# Patient Record
Sex: Female | Born: 1980 | Race: Black or African American | Hispanic: No | Smoking: Current every day smoker
Health system: Southern US, Community
[De-identification: ages and names within clinical notes are randomized; demographics above are authoritative.]

## PROBLEM LIST (undated history)

## (undated) DIAGNOSIS — I10 Essential (primary) hypertension: Secondary | ICD-10-CM

---

## 2019-02-21 ENCOUNTER — Emergency Department: Payer: Self-pay

## 2019-02-21 ENCOUNTER — Encounter: Payer: Self-pay | Admitting: Emergency Medicine

## 2019-02-21 ENCOUNTER — Emergency Department
Admission: EM | Admit: 2019-02-21 | Discharge: 2019-02-21 | Disposition: A | Discharge status: Home / Self Care | Payer: Self-pay | Attending: Emergency Medicine | Admitting: Emergency Medicine

## 2019-02-21 ENCOUNTER — Other Ambulatory Visit: Payer: Self-pay

## 2019-02-21 DIAGNOSIS — R0789 Other chest pain: Secondary | ICD-10-CM | POA: Insufficient documentation

## 2019-02-21 DIAGNOSIS — R0602 Shortness of breath: Secondary | ICD-10-CM | POA: Insufficient documentation

## 2019-02-21 DIAGNOSIS — R002 Palpitations: Secondary | ICD-10-CM | POA: Insufficient documentation

## 2019-02-21 DIAGNOSIS — Z87891 Personal history of nicotine dependence: Secondary | ICD-10-CM | POA: Insufficient documentation

## 2019-02-21 DIAGNOSIS — Z79899 Other long term (current) drug therapy: Secondary | ICD-10-CM | POA: Insufficient documentation

## 2019-02-21 DIAGNOSIS — I10 Essential (primary) hypertension: Secondary | ICD-10-CM | POA: Insufficient documentation

## 2019-02-21 HISTORY — DX: Essential (primary) hypertension: I10

## 2019-02-21 LAB — CBC
HCT: 43.3 % (ref 36.0–46.0)
Hemoglobin: 14.6 g/dL (ref 12.0–15.0)
MCH: 28.6 pg (ref 26.0–34.0)
MCHC: 33.7 g/dL (ref 30.0–36.0)
MCV: 84.7 fL (ref 80.0–100.0)
Platelets: 299 10*3/uL (ref 150–400)
RBC: 5.11 MIL/uL (ref 3.87–5.11)
RDW: 13 % (ref 11.5–15.5)
WBC: 6.6 10*3/uL (ref 4.0–10.5)
nRBC: 0 % (ref 0.0–0.2)

## 2019-02-21 LAB — BASIC METABOLIC PANEL
Anion gap: 14 (ref 5–15)
BUN: 18 mg/dL (ref 6–20)
CO2: 20 mmol/L — ABNORMAL LOW (ref 22–32)
Calcium: 10.3 mg/dL (ref 8.9–10.3)
Chloride: 102 mmol/L (ref 98–111)
Creatinine, Ser: 1.16 mg/dL — ABNORMAL HIGH (ref 0.44–1.00)
GFR calc Af Amer: >60 mL/min (ref >60)
GFR calc non Af Amer: >60 mL/min (ref >60)
Glucose, Bld: 150 mg/dL — ABNORMAL HIGH (ref 70–99)
Potassium: 3.3 mmol/L — ABNORMAL LOW (ref 3.5–5.1)
Sodium: 136 mmol/L (ref 135–145)

## 2019-02-21 LAB — TROPONIN I (HIGH SENSITIVITY)
Troponin I (High Sensitivity): 16 ng/L (ref <18)
Troponin I (High Sensitivity): 26 ng/L — ABNORMAL HIGH (ref <18)

## 2019-02-21 LAB — FIBRIN DERIVATIVES D-DIMER (ARMC ONLY): Fibrin derivatives D-dimer (ARMC): 265.46 ng/mL (FEU) (ref 0.00–499.00)

## 2019-02-21 MED ORDER — LIDOCAINE HCL 1 % IJ SOLN
0.50 | INTRAMUSCULAR | Status: DC
Start: ? — End: 2019-02-21

## 2019-02-21 NOTE — ED Triage Notes (Signed)
Patient brought in by ems from home. Patient with complaint of shortness of breath that started today. Patient was seen at [facility] today for the same and was discharged with HCTZ and an inhaler. Patient had a recent Covid test that was negative. VSS for EMS.

## 2019-02-21 NOTE — Discharge Instructions (Signed)
As we discussed, your work-up was reassuring tonight.  I think you may be suffering from episodes of supraventricular tachycardia (SVT) although this is difficult to prove without extended heart monitoring in the outpatient setting and less an episode is caught on an EKG while you are in the emergency department.  Please read through the included information and I recommend that you call the office of Dr. Nehemiah Massed to schedule a follow-up appointment with cardiology.  Let them know you are falling out from the emergency department visit.  In the meantime I recommend you avoid caffeine and other stimulants, stay hydrated, and take your regular medications.  Return to the emergency department if you develop new or worsening symptoms that concern you.

## 2019-02-21 NOTE — ED Provider Notes (Signed)
[facility] Emergency Department Provider Note  ____________________________________________   First MD Initiated Contact with Patient 02/21/19 [phone]     (approximate)  I have reviewed the triage vital signs and the nursing notes.   HISTORY  Chief Complaint Shortness of Breath    HPI Krista Sanchez is a 38 y.o. female with medical history as listed below who presents for evaluation of shortness of breath.  She says that she has been having episodes of shortness of breath for about a year.  They have gotten worse recently.  She says that the episodes start with feeling like her heart is beating very fast and then she becomes short of breath and occasionally feels some pressure on her chest.  The episodes may be very brief or may last 15 to 20 minutes.  Nothing in particular makes them better or worse and they can happen at any time.  She thinks that because she has been thinking about it and worried about a more than usual, being stressed out and worried about it seems to be making it worse.  She has not been around anyone in particular with COVID-19 although she does work in Water engineer at [employer].   She tested negative for COVID-19 about 3 weeks ago.  She has been seen at [facility] and at [facility] recently including just yesterday at [facility] for the shortness of breath.  She says that after she left, a few hours ago, she was at home and had acute onset of the same shortness of breath and rapid heartbeat sensation and it did not get better for about 15 minutes so she called 911.  She is asymptomatic at this time.  She stopped smoking about 2 months ago and that has not helped.  She was given an albuterol inhaler she tried using that when she was feeling short of breath earlier but she feels like it made it feel worse.  She denies fever/chills, sore throat, loss of smell or taste, cough, chest pain other than occasional discomfort with her episodes, nausea, vomiting, and  abdominal pain.        Past Medical History:  Diagnosis Date   Hypertension     There are no active problems to display for this patient.   History reviewed. No pertinent surgical history.  Prior to Admission medications   Medication Sig Start Date End Date Taking? Authorizing Provider  albuterol (VENTOLIN HFA) 108 (90 Base) MCG/ACT inhaler Inhale 1-2 puffs into the lungs every 4 (four) hours as needed.  02/20/19 03/22/19 Yes [provider]  hydrochlorothiazide (MICROZIDE) 12.5 MG capsule Take 12.5 mg by mouth daily. If blood pressure is greater than 120/80, you can take two capsules. 02/20/19 05/21/19 Yes [provider]  NIFEdipine (ADALAT CC) 90 MG 24 hr tablet Take 90 mg by mouth daily.  04/30/17  Yes [provider]    Allergies Patient has no known allergies.  History reviewed. No pertinent family history.  Social History Social History   Tobacco Use   Smoking status: Former Smoker    Quit date: 12/21/2018    Years since quitting: 0.1   Smokeless tobacco: Never Used  Substance Use Topics   Alcohol use: Not on file   Drug use: Not on file    Review of Systems Constitutional: No fever/chills Eyes: No visual changes. ENT: No sore throat. Cardiovascular: Denies chest pain. Respiratory: Denies shortness of breath. Gastrointestinal: No abdominal pain.  No nausea, no vomiting.  No diarrhea.  No  constipation. Genitourinary: Negative for dysuria. Musculoskeletal: Negative for neck pain.  Negative for back pain. Integumentary: Negative for rash. Neurological: Negative for headaches, focal weakness or numbness.   ____________________________________________   PHYSICAL EXAM:  VITAL SIGNS: ED Triage Vitals  Enc Vitals Group     BP 02/21/19 0300 (!) 136/99     Pulse Rate 02/21/19 0300 82     Resp 02/21/19 0300 15     Temp --      Temp src --      SpO2 02/21/19 0300 99 %     Weight 02/21/19 0020 73.5 kg (162 lb)     Height 02/21/19  0020 1.575 m (5\' 2" )     Head Circumference --      Peak Flow --      Pain Score 02/21/19 0020 8     Pain Loc --      Pain Edu? --      Excl. in GC? --     Constitutional: Alert and oriented. Well appearing and in no acute distress. Eyes: Conjunctivae are normal.  Head: Atraumatic. Nose: No congestion/rhinnorhea. Mouth/Throat: Mucous membranes are moist. Neck: No stridor.  No meningeal signs.   Cardiovascular: Variable tachycardia in the 110 - 130 range, regular rhythm. Good peripheral circulation. Grossly normal heart sounds. Respiratory: Normal respiratory effort.  No retractions. No audible wheezing. Gastrointestinal: Soft and nontender. No distention.  Musculoskeletal: No lower extremity tenderness nor edema. No gross deformities of extremities. Neurologic:  Normal speech and language. No gross focal neurologic deficits are appreciated.  Skin:  Skin is warm, dry and intact. No rash noted. Psychiatric: Mood and affect are normal. Speech and behavior are normal.  ____________________________________________   LABS (all labs ordered are listed, but only abnormal results are displayed)  Labs Reviewed  BASIC METABOLIC PANEL - Abnormal; Notable for the following components:      Result Value   Potassium 3.3 (*)    CO2 20 (*)    Glucose, Bld 150 (*)    Creatinine, Ser 1.16 (*)    All other components within normal limits  TROPONIN I (HIGH SENSITIVITY) - Abnormal; Notable for the following components:   Troponin I (High Sensitivity) 26 (*)    All other components within normal limits  CBC  FIBRIN DERIVATIVES D-DIMER ([facility] ONLY)  POC URINE PREG, ED  TROPONIN I (HIGH SENSITIVITY)   ____________________________________________  EKG  ED ECG REPORT I, Loleta Roseory Layce Sprung, the attending physician, personally viewed and interpreted this ECG.  Date: 02/21/2019 EKG Time: 00:28 Rate: 95 Rhythm: normal sinus rhythm QRS Axis: normal Intervals: normal ST/T Wave abnormalities: The  patient has inverted T waves in leads V4, V5, and V6 as well as in lead III and aVF.  I have no old EKGs against which to compare. Narrative Interpretation: no definitive evidence of acute ischemia; does not meet STEMI criteria.   ____________________________________________  RADIOLOGY I, Loleta Roseory Tamora Huneke, personally viewed and evaluated these images (plain radiographs) as part of my medical decision making, as well as reviewing the written report by the radiologist.  ED MD interpretation: No acute abnormality on chest x-ray  Official radiology report(s): Dg Chest Port 1 View  Result Date: 02/21/2019 CLINICAL DATA:  Shortness of breath EXAM: PORTABLE CHEST 1 VIEW COMPARISON:  None. FINDINGS: The heart size and mediastinal contours are within normal limits. Both lungs are clear. The visualized skeletal structures are unremarkable. IMPRESSION: No active disease. Electronically Signed   By: Katherine Mantlehristopher  Green M.D.   On: 02/21/2019 02:34  ____________________________________________   PROCEDURES   Procedure(s) performed (including Critical Care):  Procedures   ____________________________________________   INITIAL IMPRESSION / MDM / ASSESSMENT AND PLAN / ED COURSE  As part of my medical decision making, I reviewed the following data within the electronic MEDICAL RECORD NUMBER Nursing notes reviewed and incorporated, Labs reviewed , EKG interpreted , Old chart reviewed, Radiograph reviewed , Notes from prior ED visits   Differential diagnosis includes, but is not limited to, PSVT, PE, anxiety/panic attacks, COPD.  The description the patient is giving sounds suspicious to me for PSVT.  She very clearly states that she feels like the rapid heart rate starts first followed by the shortness of breath and occasional chest pressure.  She has been having these "episodes" for months even though they have been getting more severe.  I suspect anxiety is playing a component but I think she may have  occasional ectopic beats if not regular SVT episodes, but I have been unable to capture it on the monitor or on the EKG.  She is currently asymptomatic.  I reviewed the medical record [facility]  She had a normal d-dimer at that time but she is currently mildly tachycardic and I I feel it is incumbent upon me to make sure that her dimer is not elevated at this time and our emergency department with our lab.  Of note, she says that the albuterol made her feel worse and that could be consistent with her symptoms being the result of SVT.  After we discussed symptoms for a little while and discussed my working theory and differential diagnosis, we agreed to check a d-dimer tonight.  If it is elevated we will get a CTA chest to rule out pulmonary embolism but I think this is unlikely.  Her first high-sensitivity troponin was 16 so a second 1 is pending.  If her results are generally reassuring I told her that discharge and outpatient follow-up with cardiology will likely be the plan.  She understands and agrees with plan.      Clinical Course as of Feb 21 548  Wynelle LinkSun Feb 21, 2019  [id number] No acute abnormalities on CXR  [facility] [CF]  [phone] Second high-sensitivity troponin is 26 with a delta of 10 from the original.  This technically qualifies for observation/admission, but the patient was here for shortness of breath, not primarily for chest pain.  I will reassess after her d-dimer is back.  At this point I do not necessarily think she needs to be admitted but I will reassess once her dimer has resulted.  Troponin I (High Sensitivity)(!): 26 [CF]  0544 D-dimer was within normal limits.  Checking the patient as she has not had any episodes during the 5 and half hours she has been in the emergency department.  As I mentioned previously I do not think that the low troponin is significant for a primary cardiac issue and she should be appropriate for discharge and outpatient  follow-up.  I am sending a note to Dr. Gwen PoundsKowalski and giving her contact information to help establish a follow-up appointment with cardiology and to discuss whether she would benefit from cardiac monitoring as an outpatient.  I gave my usual customary return precautions and she understands and agrees with the plan.  Fibrin derivatives D-dimer [facility]): 265.46 [CF]    Clinical Course User Index [CF] Loleta RoseForbach, Henrick Mcgue, MD     ____________________________________________  FINAL CLINICAL IMPRESSION(S) / ED DIAGNOSES  Final diagnoses:  SOB (shortness of breath)  Palpitations     MEDICATIONS GIVEN DURING THIS VISIT:  Medications - No data to display   ED Discharge Orders    None      *Please note:  Krista Sanchez was evaluated in Emergency Department on 02/21/2019 for the symptoms described in the history of present illness. She was evaluated in the context of the global COVID-19 pandemic, which necessitated consideration that the patient might be at risk for infection with the SARS-CoV-2 virus that causes COVID-19. Institutional protocols and algorithms that pertain to the evaluation of patients at risk for COVID-19 are in a state of rapid change based on information released by regulatory bodies including the CDC and federal and state organizations. These policies and algorithms were followed during the patient's care in the ED.  Some ED evaluations and interventions may be delayed as a result of limited staffing during the pandemic.*  Note:  This document was prepared using Dragon voice recognition software and may include unintentional dictation errors.   Loleta RoseForbach, Christoher Drudge, MD 02/21/19 [phone]

## 2019-10-28 ENCOUNTER — Emergency Department: Payer: BC Managed Care – PPO

## 2019-10-28 ENCOUNTER — Encounter: Payer: Self-pay | Admitting: Emergency Medicine

## 2019-10-28 ENCOUNTER — Emergency Department
Admission: EM | Admit: 2019-10-28 | Discharge: 2019-10-28 | Disposition: A | Discharge status: Home / Self Care | Payer: BC Managed Care – PPO | Attending: Emergency Medicine | Admitting: Emergency Medicine

## 2019-10-28 ENCOUNTER — Other Ambulatory Visit: Payer: Self-pay

## 2019-10-28 DIAGNOSIS — R0789 Other chest pain: Secondary | ICD-10-CM | POA: Insufficient documentation

## 2019-10-28 DIAGNOSIS — Z5321 Procedure and treatment not carried out due to patient leaving prior to being seen by health care provider: Secondary | ICD-10-CM | POA: Insufficient documentation

## 2019-10-28 LAB — CBC
HCT: 41.5 % (ref 36.0–46.0)
Hemoglobin: 13.6 g/dL (ref 12.0–15.0)
MCH: 27.8 pg (ref 26.0–34.0)
MCHC: 32.8 g/dL (ref 30.0–36.0)
MCV: 84.9 fL (ref 80.0–100.0)
Platelets: 251 10*3/uL (ref 150–400)
RBC: 4.89 MIL/uL (ref 3.87–5.11)
RDW: 14.6 % (ref 11.5–15.5)
WBC: 7.1 10*3/uL (ref 4.0–10.5)
nRBC: 0 % (ref 0.0–0.2)

## 2019-10-28 LAB — BASIC METABOLIC PANEL
Anion gap: 12 (ref 5–15)
BUN: 15 mg/dL (ref 6–20)
CO2: 21 mmol/L — ABNORMAL LOW (ref 22–32)
Calcium: 9.7 mg/dL (ref 8.9–10.3)
Chloride: 107 mmol/L (ref 98–111)
Creatinine, Ser: 1 mg/dL (ref 0.44–1.00)
GFR calc Af Amer: >60 mL/min (ref >60)
GFR calc non Af Amer: >60 mL/min (ref >60)
Glucose, Bld: 98 mg/dL (ref 70–99)
Potassium: 3.8 mmol/L (ref 3.5–5.1)
Sodium: 140 mmol/L (ref 135–145)

## 2019-10-28 LAB — TROPONIN I (HIGH SENSITIVITY): Troponin I (High Sensitivity): 27 ng/L — ABNORMAL HIGH (ref <18)

## 2019-10-28 NOTE — ED Triage Notes (Signed)
Pt called for reassessment and specimen collection of second troponin, no response

## 2019-10-28 NOTE — ED Triage Notes (Signed)
Pt called from [facility] to triage room, no response

## 2019-10-28 NOTE — ED Triage Notes (Signed)
Lab called for blood draw assistance 

## 2019-10-28 NOTE — ED Triage Notes (Signed)
Pt called from [facility] to triage room for reassessment and specimen collection

## 2019-10-28 NOTE — ED Triage Notes (Signed)
Pt here for CP starting yesterday. Hx of same but worse than normal. No SHOB, but feels like hard to take a deep breath. VSS. NAD. Unlabored. Color WNL

## 2019-10-29 ENCOUNTER — Telehealth: Payer: Self-pay | Admitting: Emergency Medicine

## 2019-11-01 NOTE — Telephone Encounter (Signed)
Called patient again due to left without seeing provider.  She has not called her doctor.   I explained that troponin was elevated. I advised her to call her doctor or her cardiologist to ask them to check her lab results.  She says she is taking her bp med regularly.

## 2020-11-13 ENCOUNTER — Encounter: Payer: Self-pay | Admitting: Physician Assistant

## 2020-11-13 ENCOUNTER — Emergency Department: Payer: Self-pay

## 2020-11-13 ENCOUNTER — Emergency Department
Admission: EM | Admit: 2020-11-13 | Discharge: 2020-11-13 | Disposition: A | Discharge status: Home / Self Care | Payer: Self-pay | Attending: Emergency Medicine | Admitting: Emergency Medicine

## 2020-11-13 ENCOUNTER — Other Ambulatory Visit: Payer: Self-pay

## 2020-11-13 DIAGNOSIS — F17200 Nicotine dependence, unspecified, uncomplicated: Secondary | ICD-10-CM | POA: Insufficient documentation

## 2020-11-13 DIAGNOSIS — Z79899 Other long term (current) drug therapy: Secondary | ICD-10-CM | POA: Insufficient documentation

## 2020-11-13 DIAGNOSIS — W108XXA Fall (on) (from) other stairs and steps, initial encounter: Secondary | ICD-10-CM | POA: Insufficient documentation

## 2020-11-13 DIAGNOSIS — I10 Essential (primary) hypertension: Secondary | ICD-10-CM | POA: Insufficient documentation

## 2020-11-13 DIAGNOSIS — M1120 Other chondrocalcinosis, unspecified site: Secondary | ICD-10-CM

## 2020-11-13 DIAGNOSIS — M79672 Pain in left foot: Secondary | ICD-10-CM | POA: Insufficient documentation

## 2020-11-13 DIAGNOSIS — S39012A Strain of muscle, fascia and tendon of lower back, initial encounter: Secondary | ICD-10-CM | POA: Insufficient documentation

## 2020-11-13 DIAGNOSIS — M25572 Pain in left ankle and joints of left foot: Secondary | ICD-10-CM | POA: Insufficient documentation

## 2020-11-13 DIAGNOSIS — W19XXXA Unspecified fall, initial encounter: Secondary | ICD-10-CM

## 2020-11-13 DIAGNOSIS — M545 Low back pain, unspecified: Secondary | ICD-10-CM

## 2020-11-13 LAB — POC URINE PREG, ED: Preg Test, Ur: NEGATIVE

## 2020-11-13 MED ORDER — NIFEDIPINE ER OSMOTIC RELEASE 90 MG PO TB24: 90.0000 mg | ORAL_TABLET | Freq: Every day | ORAL | 6 refills | Status: DC

## 2020-11-13 MED ORDER — MELOXICAM 15 MG PO TABS: 15.0000 mg | ORAL_TABLET | Freq: Every day | ORAL | 2 refills | Status: AC

## 2020-11-13 MED ORDER — TRAMADOL HCL 50 MG PO TABS: 50.0000 mg | ORAL_TABLET | Freq: Four times a day (QID) | ORAL | 0 refills | Status: DC | PRN

## 2020-11-13 NOTE — ED Triage Notes (Signed)
Pt fell on Friday and injured left leg/knee. Pt states the left ankle has been fractured in the past. Pain when walking. Unable to bear much weight.

## 2020-11-13 NOTE — ED Notes (Signed)
See triage note  Presents with pain to left foot/ankle pain  States she fell on Friday  Landed on both knees  Abrasions noted to both knees  But having pain mainly to left ankle area  Unable to bear full wt  Also having lower back pain from fall

## 2020-11-13 NOTE — ED Provider Notes (Signed)
[facility] Emergency Department Provider Note  ____________________________________________   Event Date/Time   First MD Initiated Contact with Patient 11/13/20 1142     (approximate)  I have reviewed the triage vital signs and the nursing notes.   HISTORY  Chief Complaint Leg Injury    HPI Krista Sanchez is a 40 y.o. female [facility] emergency department complaining of left foot and ankle pain.  States she fell on Friday.  Landed on both knees.  States that she was coming down steps and tried to mis-stepping on her child and fell forwards.  She is also complaining of low back pain at this time.  Patient states she has been out of her blood pressure medication for about 4 days.    Past Medical History:  Diagnosis Date  . Hypertension     There are no problems to display for this patient.   History reviewed. No pertinent surgical history.  Prior to Admission medications   Medication Sig Start Date End Date Taking? Authorizing Provider  meloxicam (MOBIC) 15 MG tablet Take 1 tablet (15 mg total) by mouth daily. 11/13/20 11/13/21 Yes Chaim Gatley, Roselyn Bering, PA-C  NIFEdipine (PROCARDIA XL/NIFEDICAL-XL) 90 MG 24 hr tablet Take 1 tablet (90 mg total) by mouth daily. 11/13/20  Yes Tayley Mudrick, Roselyn Bering, PA-C  traMADol (ULTRAM) 50 MG tablet Take 1 tablet (50 mg total) by mouth every 6 (six) hours as needed. 11/13/20  Yes Kahlea Cobert, Roselyn Bering, PA-C  albuterol (VENTOLIN HFA) 108 (90 Base) MCG/ACT inhaler Inhale 1-2 puffs into the lungs every 4 (four) hours as needed.  02/20/19 03/22/19  [provider]  hydrochlorothiazide (MICROZIDE) 12.5 MG capsule Take 12.5 mg by mouth daily. If blood pressure is greater than 120/80, you can take two capsules. 02/20/19 05/21/19  [provider]    Allergies Patient has no known allergies.  History reviewed. No pertinent family history.  Social History Social History   Tobacco Use  . Smoking status: Current Every Day Smoker     Last attempt to quit: 12/21/2018    Years since quitting: 1.8  . Smokeless tobacco: Never Used  Substance Use Topics  . Alcohol use: Yes  . Drug use: Never    Review of Systems  Constitutional: No fever/chills Eyes: No visual changes. ENT: No sore throat. Respiratory: Denies cough Cardiovascular: Denies chest pain Gastrointestinal: Denies abdominal pain Genitourinary: Negative for dysuria. Musculoskeletal: Positive for back pain. Skin: Negative for rash. Psychiatric: no mood changes,     ____________________________________________   PHYSICAL EXAM:  VITAL SIGNS: ED Triage Vitals  Enc Vitals Group     BP 11/13/20 1118 (!) 203/113     Pulse Rate 11/13/20 1118 88     Resp 11/13/20 1118 18     Temp 11/13/20 1118 98.7 F (37.1 C)     Temp Source 11/13/20 1118 Oral     SpO2 11/13/20 1118 100 %     Weight 11/13/20 1116 165 lb (74.8 kg)     Height 11/13/20 1116 5\' 2"  (1.575 m)     Head Circumference --      Peak Flow --      Pain Score 11/13/20 1116 8     Pain Loc --      Pain Edu? --      Excl. in GC? --     Constitutional: Alert and oriented. Well appearing and in no acute distress. Eyes: Conjunctivae are normal.  Head: Atraumatic. Nose: No congestion/rhinnorhea. Mouth/Throat: Mucous membranes are moist.  Neck:  supple no lymphadenopathy noted Cardiovascular: Normal rate, regular rhythm. Heart sounds are normal Respiratory: Normal respiratory effort.  No retractions, lungs c t a  GU: deferred Musculoskeletal: FROM all extremities, warm and well perfused, abrasions noted on the knees bilaterally, left foot is tender and swollen, warm to touch, lumbar spine is mildly tender with spasms noted to the right side. Neurologic:  Normal speech and language.  Skin:  Skin is warm, dry and intact. No rash noted. Psychiatric: Mood and affect are normal. Speech and behavior are normal.  ____________________________________________   LABS (all labs ordered are listed, but  only abnormal results are displayed)  Labs Reviewed  POC URINE PREG, ED   ____________________________________________   ____________________________________________  RADIOLOGY  X-ray of the left foot and lumbar spine  ____________________________________________   PROCEDURES  Procedure(s) performed: Postop shoe applied by nursing staff   Procedures    ____________________________________________   INITIAL IMPRESSION / ASSESSMENT AND PLAN / ED COURSE  Pertinent labs & imaging results that were available during my care of the patient were reviewed by me and considered in my medical decision making (see chart for details).   Patient is a 40 year old female with history of hypertension that presents with left foot pain and low back pain after fall.  See HPI.  Physical exam shows patient appear very stable.  Blood pressure is elevated but this is most likely due to her not taking her medication.  X-ray of the left foot and lumbar spine reviewed by me and confirmed by radiology to be negative for any acute abnormality  I did explain the findings to the patient.  She was given a prescription for her blood pressure medication which is nifedipine 90 mg daily.  She was also given refills.  Instructed to follow-up with her primary care provider.  Was given meloxicam and tramadol for pain.  She is to follow-up with her regular doctor.  We did discuss concerns of gout in the left foot.  She is to try black cherry juice or black cherries.  Take the meloxicam.  Avoid ground meat or other meats with purines and avoid alcohol.  She states she understands.  She is discharged stable condition.     [name] was evaluated in Emergency Department on 11/13/2020 for the symptoms described in the history of present illness. She was evaluated in the context of the global COVID-19 pandemic, which necessitated consideration that the patient might be at risk for infection with the SARS-CoV-2 virus  that causes COVID-19. Institutional protocols and algorithms that pertain to the evaluation of patients at risk for COVID-19 are in a state of rapid change based on information released by regulatory bodies including the CDC and federal and state organizations. These policies and algorithms were followed during the patient's care in the ED.    As part of my medical decision making, I reviewed the following data within the electronic MEDICAL RECORD NUMBER Nursing notes reviewed and incorporated, Old chart reviewed, Radiograph reviewed , Notes from prior ED visits and [place] Controlled Substance Database  ____________________________________________   FINAL CLINICAL IMPRESSION(S) / ED DIAGNOSES  Final diagnoses:  Fall, initial encounter  Lumbar strain, initial encounter  Acute midline low back pain without sciatica  Pseudogout      NEW MEDICATIONS STARTED DURING THIS VISIT:  Discharge Medication List as of 11/13/2020  1:43 PM    START taking these medications   Details  meloxicam (MOBIC) 15 MG tablet Take 1 tablet (15 mg total) by  mouth daily., Starting Mon 11/13/2020, Until Tue 11/13/2021, Normal    traMADol (ULTRAM) 50 MG tablet Take 1 tablet (50 mg total) by mouth every 6 (six) hours as needed., Starting Mon 11/13/2020, Normal         Note:  This document was prepared using Dragon voice recognition software and may include unintentional dictation errors.    Faythe Ghee, PA-C 11/13/20 1446    Gilles Chiquito, MD 11/13/20 2032

## 2020-11-13 NOTE — Discharge Instructions (Signed)
Follow-up with your regular doctor concerning your blood pressure.  I did give you multiple refills.  Please try to schedule an appointment prior to this running out. Take the meloxicam and tramadol for pain/swelling. For possible gout, try to eat black cherries, drink plenty of water, avoid ground meat and alcohol.

## 2021-12-13 ENCOUNTER — Emergency Department: Payer: Self-pay

## 2021-12-13 ENCOUNTER — Emergency Department
Admission: EM | Admit: 2021-12-13 | Discharge: 2021-12-13 | Disposition: A | Discharge status: Home / Self Care | Payer: Self-pay | Attending: Emergency Medicine | Admitting: Emergency Medicine

## 2021-12-13 ENCOUNTER — Other Ambulatory Visit: Payer: Self-pay

## 2021-12-13 DIAGNOSIS — R112 Nausea with vomiting, unspecified: Secondary | ICD-10-CM | POA: Insufficient documentation

## 2021-12-13 DIAGNOSIS — R079 Chest pain, unspecified: Secondary | ICD-10-CM

## 2021-12-13 DIAGNOSIS — R0602 Shortness of breath: Secondary | ICD-10-CM | POA: Insufficient documentation

## 2021-12-13 DIAGNOSIS — R0789 Other chest pain: Secondary | ICD-10-CM | POA: Insufficient documentation

## 2021-12-13 DIAGNOSIS — I10 Essential (primary) hypertension: Secondary | ICD-10-CM | POA: Insufficient documentation

## 2021-12-13 DIAGNOSIS — R000 Tachycardia, unspecified: Secondary | ICD-10-CM | POA: Insufficient documentation

## 2021-12-13 DIAGNOSIS — K2100 Gastro-esophageal reflux disease with esophagitis, without bleeding: Secondary | ICD-10-CM | POA: Insufficient documentation

## 2021-12-13 LAB — BASIC METABOLIC PANEL
Anion gap: 10 (ref 5–15)
BUN: 13 mg/dL (ref 6–20)
CO2: 22 mmol/L (ref 22–32)
Calcium: 9.6 mg/dL (ref 8.9–10.3)
Chloride: 103 mmol/L (ref 98–111)
Creatinine, Ser: 1.13 mg/dL — ABNORMAL HIGH (ref 0.44–1.00)
GFR, Estimated: >60 mL/min (ref >60)
Glucose, Bld: 112 mg/dL — ABNORMAL HIGH (ref 70–99)
Potassium: 3.5 mmol/L (ref 3.5–5.1)
Sodium: 135 mmol/L (ref 135–145)

## 2021-12-13 LAB — HCG, QUANTITATIVE, PREGNANCY: hCG, Beta Chain, Quant, S: 1 m[IU]/mL (ref <5)

## 2021-12-13 LAB — CBC
HCT: 44.1 % (ref 36.0–46.0)
Hemoglobin: 14.2 g/dL (ref 12.0–15.0)
MCH: 28 pg (ref 26.0–34.0)
MCHC: 32.2 g/dL (ref 30.0–36.0)
MCV: 87 fL (ref 80.0–100.0)
Platelets: 300 10*3/uL (ref 150–400)
RBC: 5.07 MIL/uL (ref 3.87–5.11)
RDW: 13.2 % (ref 11.5–15.5)
WBC: 6.2 10*3/uL (ref 4.0–10.5)
nRBC: 0 % (ref 0.0–0.2)

## 2021-12-13 LAB — TROPONIN I (HIGH SENSITIVITY)
Troponin I (High Sensitivity): 17 ng/L (ref <18)
Troponin I (High Sensitivity): 17 ng/L (ref <18)

## 2021-12-13 MED ORDER — IOHEXOL 350 MG/ML SOLN
75.0000 mL | Freq: Once | INTRAVENOUS | Status: AC | PRN
  Administered 2021-12-13: 75 mL via INTRAVENOUS

## 2021-12-13 MED ORDER — LIDOCAINE VISCOUS HCL 2 % MT SOLN
15.0000 mL | Freq: Once | OROMUCOSAL | Status: AC
  Administered 2021-12-13: 15 mL via ORAL
  Filled 2021-12-13: qty 15

## 2021-12-13 MED ORDER — ALUM & MAG HYDROXIDE-SIMETH 200-200-20 MG/5ML PO SUSP
15.0000 mL | Freq: Once | ORAL | Status: AC
  Administered 2021-12-13: 15 mL via ORAL
  Filled 2021-12-13: qty 30

## 2021-12-13 MED ORDER — OMEPRAZOLE MAGNESIUM 20 MG PO TBEC: 20.0000 mg | DELAYED_RELEASE_TABLET | Freq: Every day | ORAL | 1 refills | Status: DC

## 2021-12-13 NOTE — ED Notes (Signed)
Pt to CT

## 2021-12-13 NOTE — ED Triage Notes (Signed)
Pt to ED via POV from home. Pt reports epigastric pain and burning x1 month that was worse today. Pt reports pain feels like it is going through her back. Pt with hx HTN. Pt also reports HA, blurry vision and occular pressure.

## 2021-12-13 NOTE — ED Notes (Signed)
Pt reports lido-maalox mix has not helped in the past but states she is willing to try it again.

## 2021-12-13 NOTE — ED Notes (Signed)
See triage note. Pt reports burning with inspiration for the past month that worsened today. Also endorses upper back pain. Ambulatory to treatment room. NAD noted.

## 2021-12-13 NOTE — ED Provider Notes (Signed)
She is  [facility] Provider Note    Event Date/Time   First MD Initiated Contact with Patient 12/13/21 1257     (approximate)   History   Chief Complaint Chest Pain   HPI  Krista Sanchez is a 41 y.o. female with past medical history of hypertension who presents to the ED complaining of chest pain.  Patient reports that she has been dealing with intermittent aching pain that starts in the middle of her back between her shoulder blades.  States that this pain then moves forward into her chest, where it feels like more of a burning.  These episodes have been occurring for about 1 month, can come on at any time and do not seem to be exacerbated or alleviated by anything in particular.  Symptoms last for a couple of hours at a time before gradually resolving with rest.  She reports feeling short of breath with these episodes and the burning pain seems to be worse when she takes a deep breath.  She has not had any fevers or cough and denies any pain or swelling in her legs.  She felt nauseous with 1 episode of vomiting around the onset of pain today, but has not had any abdominal pain or changes in her bowel movements.  She denies any significant cardiac history, states that she only takes medicine for her blood pressure.  She does report that she has been dealing with intermittent blurry vision in both eyes for the past month along with this chest discomfort.  She denies any speech changes, extremity numbness or weakness.     Physical Exam   Triage Vital Signs: ED Triage Vitals  Enc Vitals Group     BP 12/13/21 1246 (!) 148/100     Pulse Rate 12/13/21 1246 (!) 103     Resp 12/13/21 1246 20     Temp 12/13/21 1246 98.6 F (37 C)     Temp Source 12/13/21 1246 Oral     SpO2 12/13/21 1246 100 %     Weight 12/13/21 1243 175 lb (79.4 kg)     Height 12/13/21 1243 5\' 2"  (1.575 m)     Head Circumference --      Peak Flow --      Pain Score 12/13/21 1243 8     Pain Loc --       Pain Edu? --      Excl. in GC? --     Most recent vital signs: Vitals:   12/13/21 1700 12/13/21 1708  BP: (!) 162/113 (!) 145/109  Pulse: 77 77  Resp:    Temp:    SpO2: 100% 100%    Constitutional: Alert and oriented. Eyes: Conjunctivae are normal.  Pupils equal, round, and reactive to light bilaterally.  Extraocular movements intact. Head: Atraumatic. Nose: No congestion/rhinnorhea. Mouth/Throat: Mucous membranes are moist.  Cardiovascular: Normal rate, regular rhythm. Grossly normal heart sounds.  2+ radial pulses bilaterally. Respiratory: Normal respiratory effort.  No retractions. Lungs CTAB.  No chest wall tenderness to palpation. Gastrointestinal: Soft and nontender. No distention. Musculoskeletal: No lower extremity tenderness nor edema.  Neurologic:  Normal speech and language. No gross focal neurologic deficits are appreciated.    ED Results / Procedures / Treatments   Labs (all labs ordered are listed, but only abnormal results are displayed) Labs Reviewed  BASIC METABOLIC PANEL - Abnormal; Notable for the following components:      Result Value   Glucose, Bld 112 (*)  Creatinine, Ser 1.13 (*)    All other components within normal limits  CBC  HCG, QUANTITATIVE, PREGNANCY  POC URINE PREG, ED  TROPONIN I (HIGH SENSITIVITY)  TROPONIN I (HIGH SENSITIVITY)     EKG  ED ECG REPORT I, Chesley Noon, the attending physician, personally viewed and interpreted this ECG.   Date: 12/13/2021  EKG Time: 12:43  Rate: 104  Rhythm: sinus tachycardia, frequent PVC's noted  Axis: Normal  Intervals:none  ST&T Change: Inferolateral T wave inversions, new from previous  RADIOLOGY CTA of chest reviewed and interpreted by me with no pulmonary embolus or focal infiltrate noted.  PROCEDURES:  Critical Care performed: No  Procedures   MEDICATIONS ORDERED IN ED: Medications  iohexol (OMNIPAQUE) 350 MG/ML injection 75 mL (75 mLs Intravenous Contrast Given  12/13/21 1434)  alum & mag hydroxide-simeth (MAALOX/MYLANTA) 200-200-20 MG/5ML suspension 15 mL (15 mLs Oral Given 12/13/21 1708)    And  lidocaine (XYLOCAINE) 2 % viscous mouth solution 15 mL (15 mLs Oral Given 12/13/21 1707)     IMPRESSION / MDM / ASSESSMENT AND PLAN / ED COURSE  I reviewed the triage vital signs and the nursing notes.                              41 y.o. female with past medical history of hypertension who presents to the ED complaining of intermittent pain starting in her back and moving in towards her chest for the past month, which is associated with difficulty breathing and worse when she takes a deep breath.  Differential diagnosis includes, but is not limited to, ACS, PE, dissection, pneumonia, pneumothorax, GERD, musculoskeletal pain, anxiety, intracranial process, electrolyte abnormality.  Patient well-appearing and in no acute distress, vitals remarkable for mild tachycardia as well as elevated blood pressure.  EKG shows sinus rhythm with frequent PVCs along with inferolateral T wave changes compared to previous.  Symptoms are atypical for ACS but we will screen 2 sets of troponin given intermittent episodes.  With pleuritic pain, we will also check CTA of her chest to rule out PE.  Patient does endorse intermittent blurry vision, but has no focal neurologic deficits on exam.  Low suspicion for acute stroke or TIA, but we will screen CT head for possible explanation of the symptoms.  CT head as well as CTA of chest are negative for acute process.  2 sets of troponin are negative and I doubt ACS.  Patient was given GI cocktail given her burning pain, reports improvement in pain and I suspect symptoms are secondary to GERD.  She is appropriate for discharge home with PCP follow-up, will be prescribed omeprazole and was counseled to return to the ED for new or worsening symptoms.  Patient agrees with plan.      FINAL CLINICAL IMPRESSION(S) / ED DIAGNOSES   Final  diagnoses:  Nonspecific chest pain  Gastroesophageal reflux disease with esophagitis without hemorrhage     Rx / DC Orders   ED Discharge Orders          Ordered    omeprazole (PRILOSEC OTC) 20 MG tablet  Daily        12/13/21 1739             Note:  This document was prepared using Dragon voice recognition software and may include unintentional dictation errors.   Chesley Noon, MD 12/13/21 [phone]

## 2022-01-15 ENCOUNTER — Telehealth: Payer: Self-pay

## 2022-07-22 DEATH — deceased

## 2023-11-14 IMAGING — CT CT HEAD W/O CM
4 series · 17 of 47 positions shown, 19 images · non-contrast
Comparison: None Available.

CLINICAL DATA: Neuro deficit, acute, stroke suspected. Blurry
vision.



[Series 2: head bone · axial · 0.39mm/px · z∈[-190,-138]mm · 4 of 76 slices shown]
[im 8/76  bone]
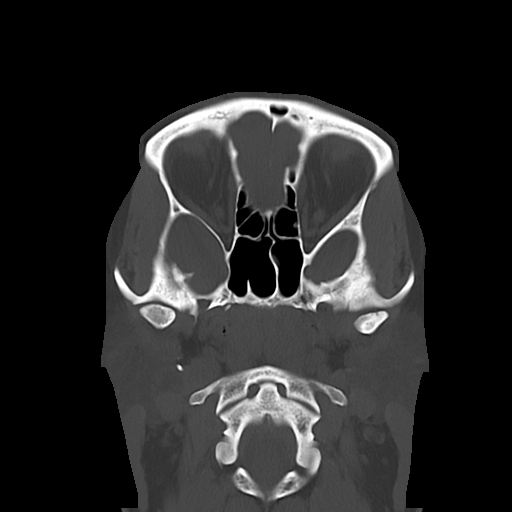
[im 16/76  bone]
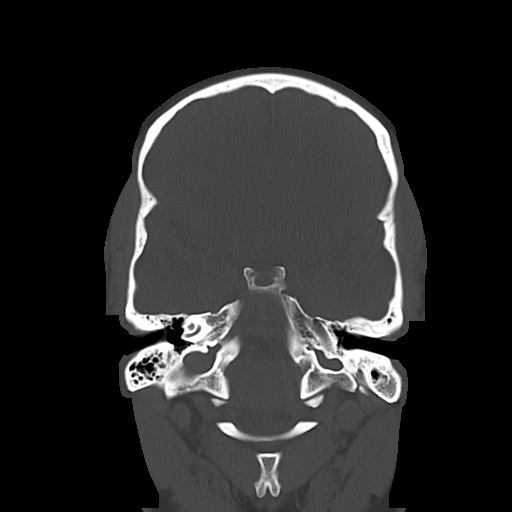
[im 23/76  bone]
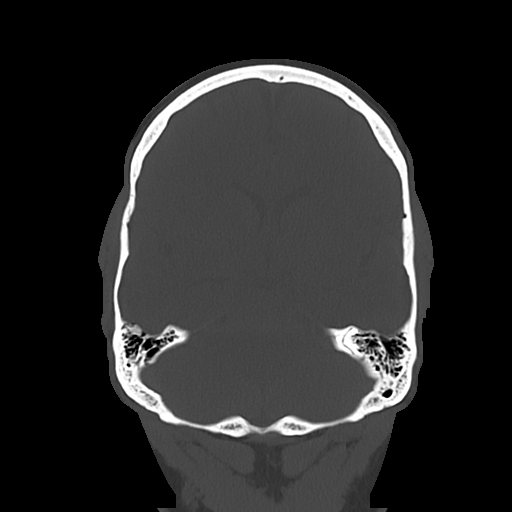
[im 34/76  bone]
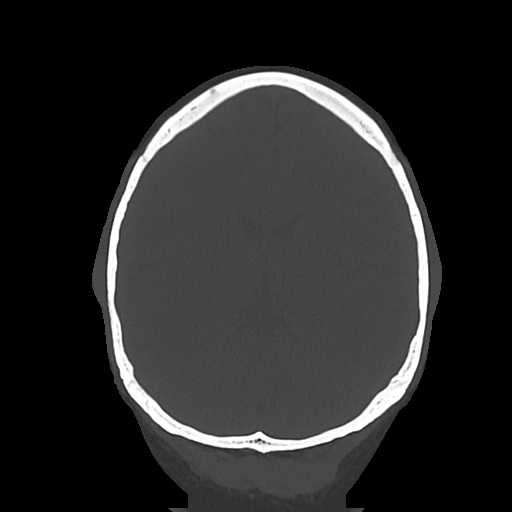

[Series 4: head wo · axial · 0.39mm/px · z∈[-189,-74]mm · 7 of 31 slices shown, 9 images]
[im 4/31  brain]
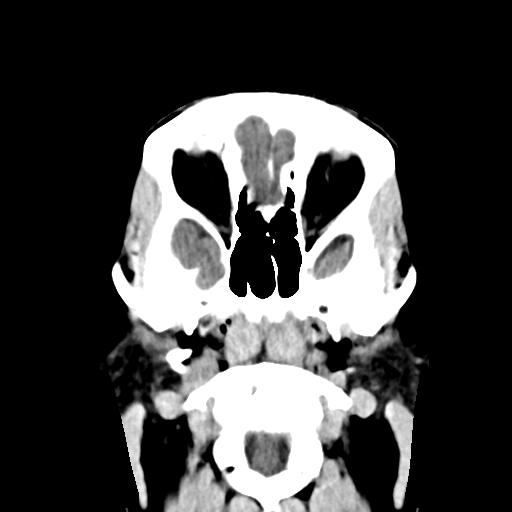
[im 4/31  bone]
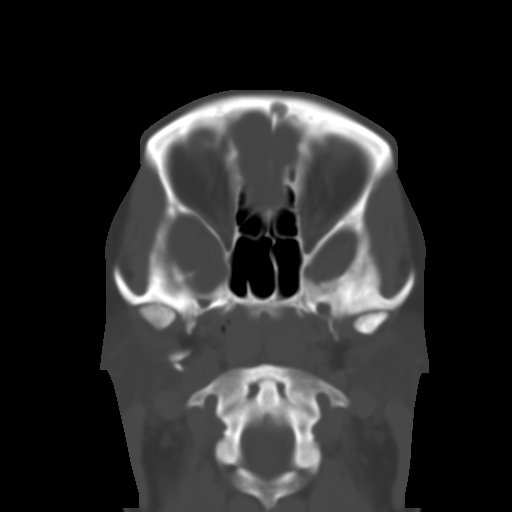
[im 8/31  brain]
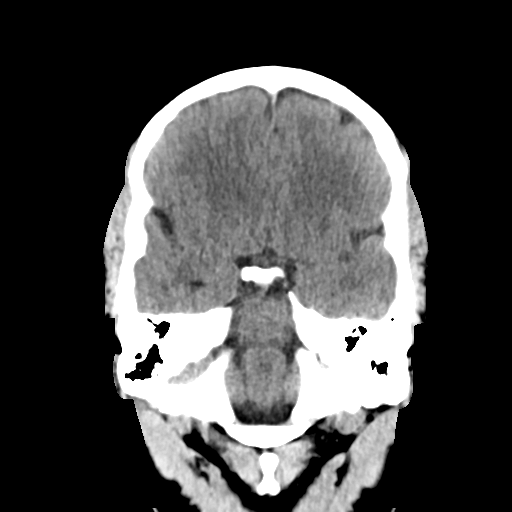
[im 12/31  brain]
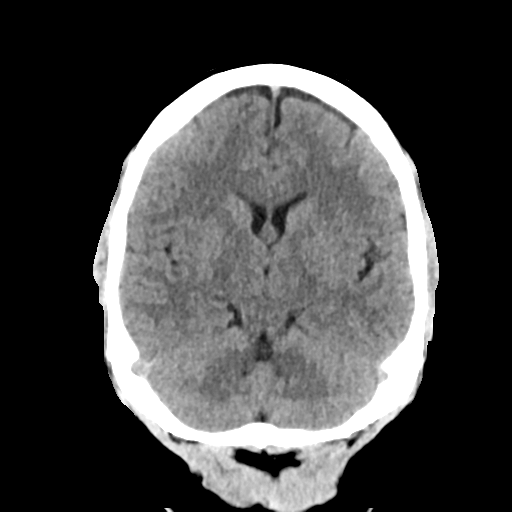
[im 16/31  brain]
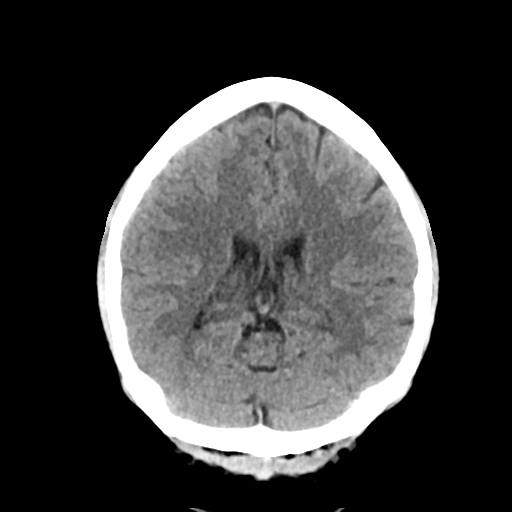
[im 19/31  brain]
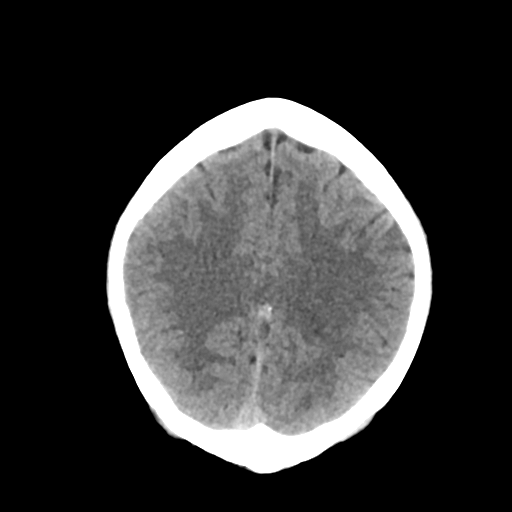
[im 19/31  bone]
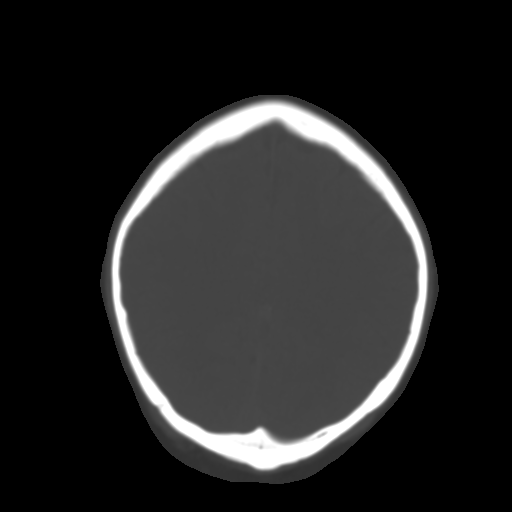
[im 23/31  brain]
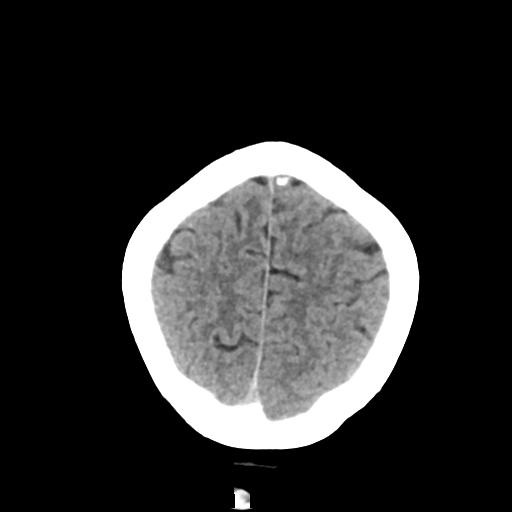
[im 27/31  brain]
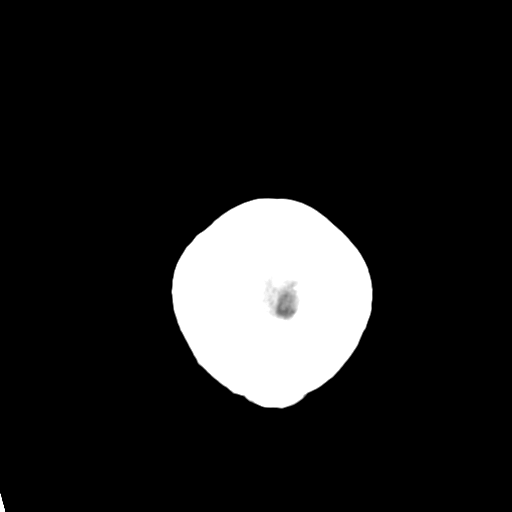

[Series 5: cor soft · coronal · 0.30mm/px · 3 of 64 slices shown]
[im 22/64  brain]
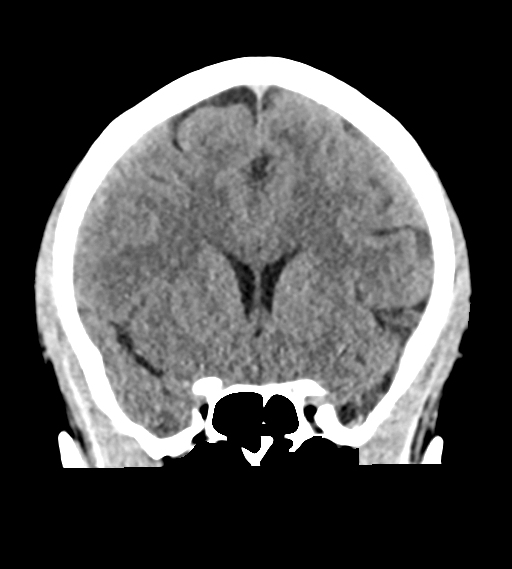
[im 29/64  brain]
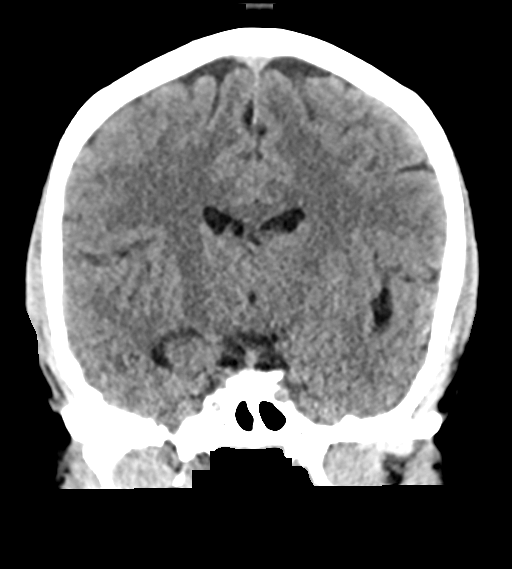
[im 36/64  brain]
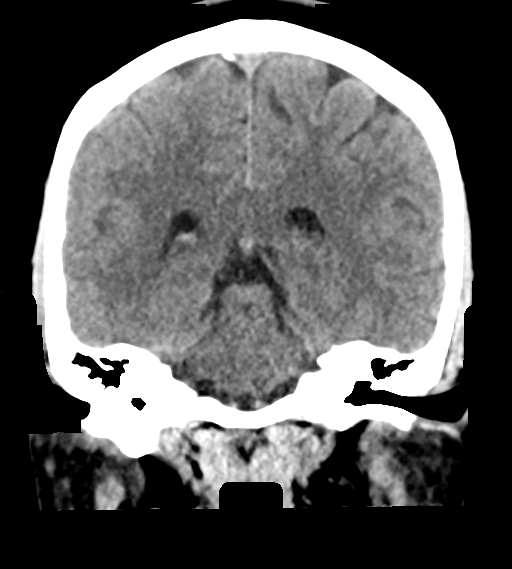

[Series 6: sag soft · sagittal · 0.33mm/px · 3 of 52 slices shown]
[im 18/52  brain]
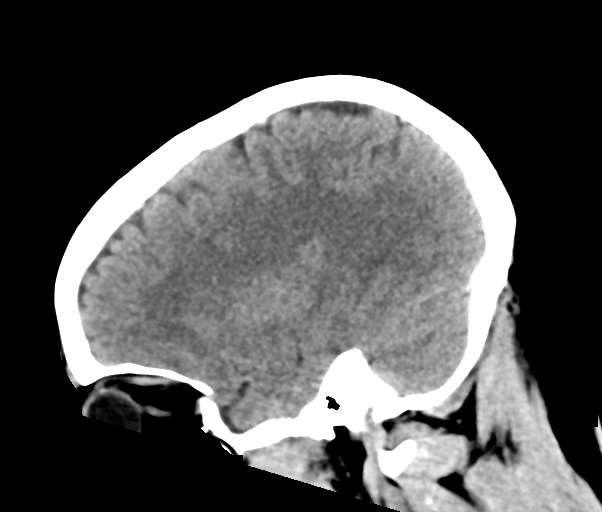
[im 26/52  brain]
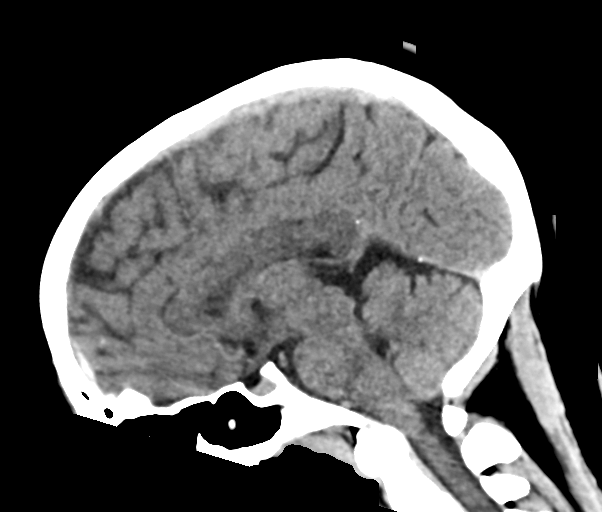
[im 35/52  brain]
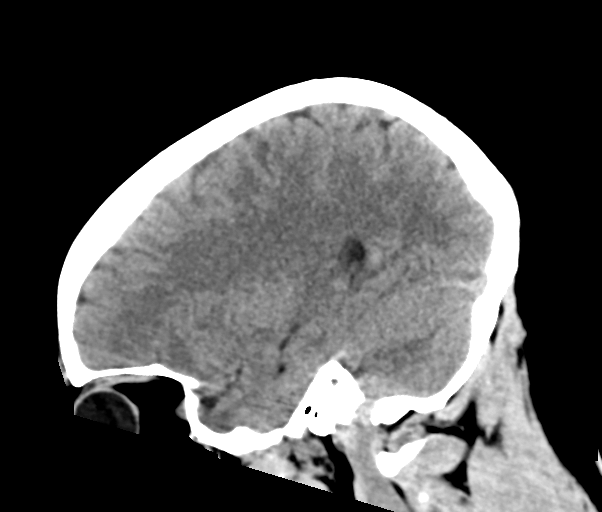

[17 of 47 positions shown; findings below may reference images not displayed]

FINDINGS: Brain: No evidence of acute infarction, hemorrhage, hydrocephalus,
extra-axial collection or mass lesion/mass effect.

Vascular: No hyperdense vessel or unexpected calcification.

Skull: Normal. Negative for fracture or focal lesion.

Sinuses/Orbits: No acute finding.

Other: None.
IMPRESSION: No acute intracranial abnormality.

## 2023-11-14 IMAGING — CR DG CHEST 2V
2 series · 2 of 2 positions shown · non-contrast
Comparison: 10/28/2019 and prior studies

CLINICAL DATA: Chest pain.

EXAM:
CHEST - 2 VIEW

[chest pa]
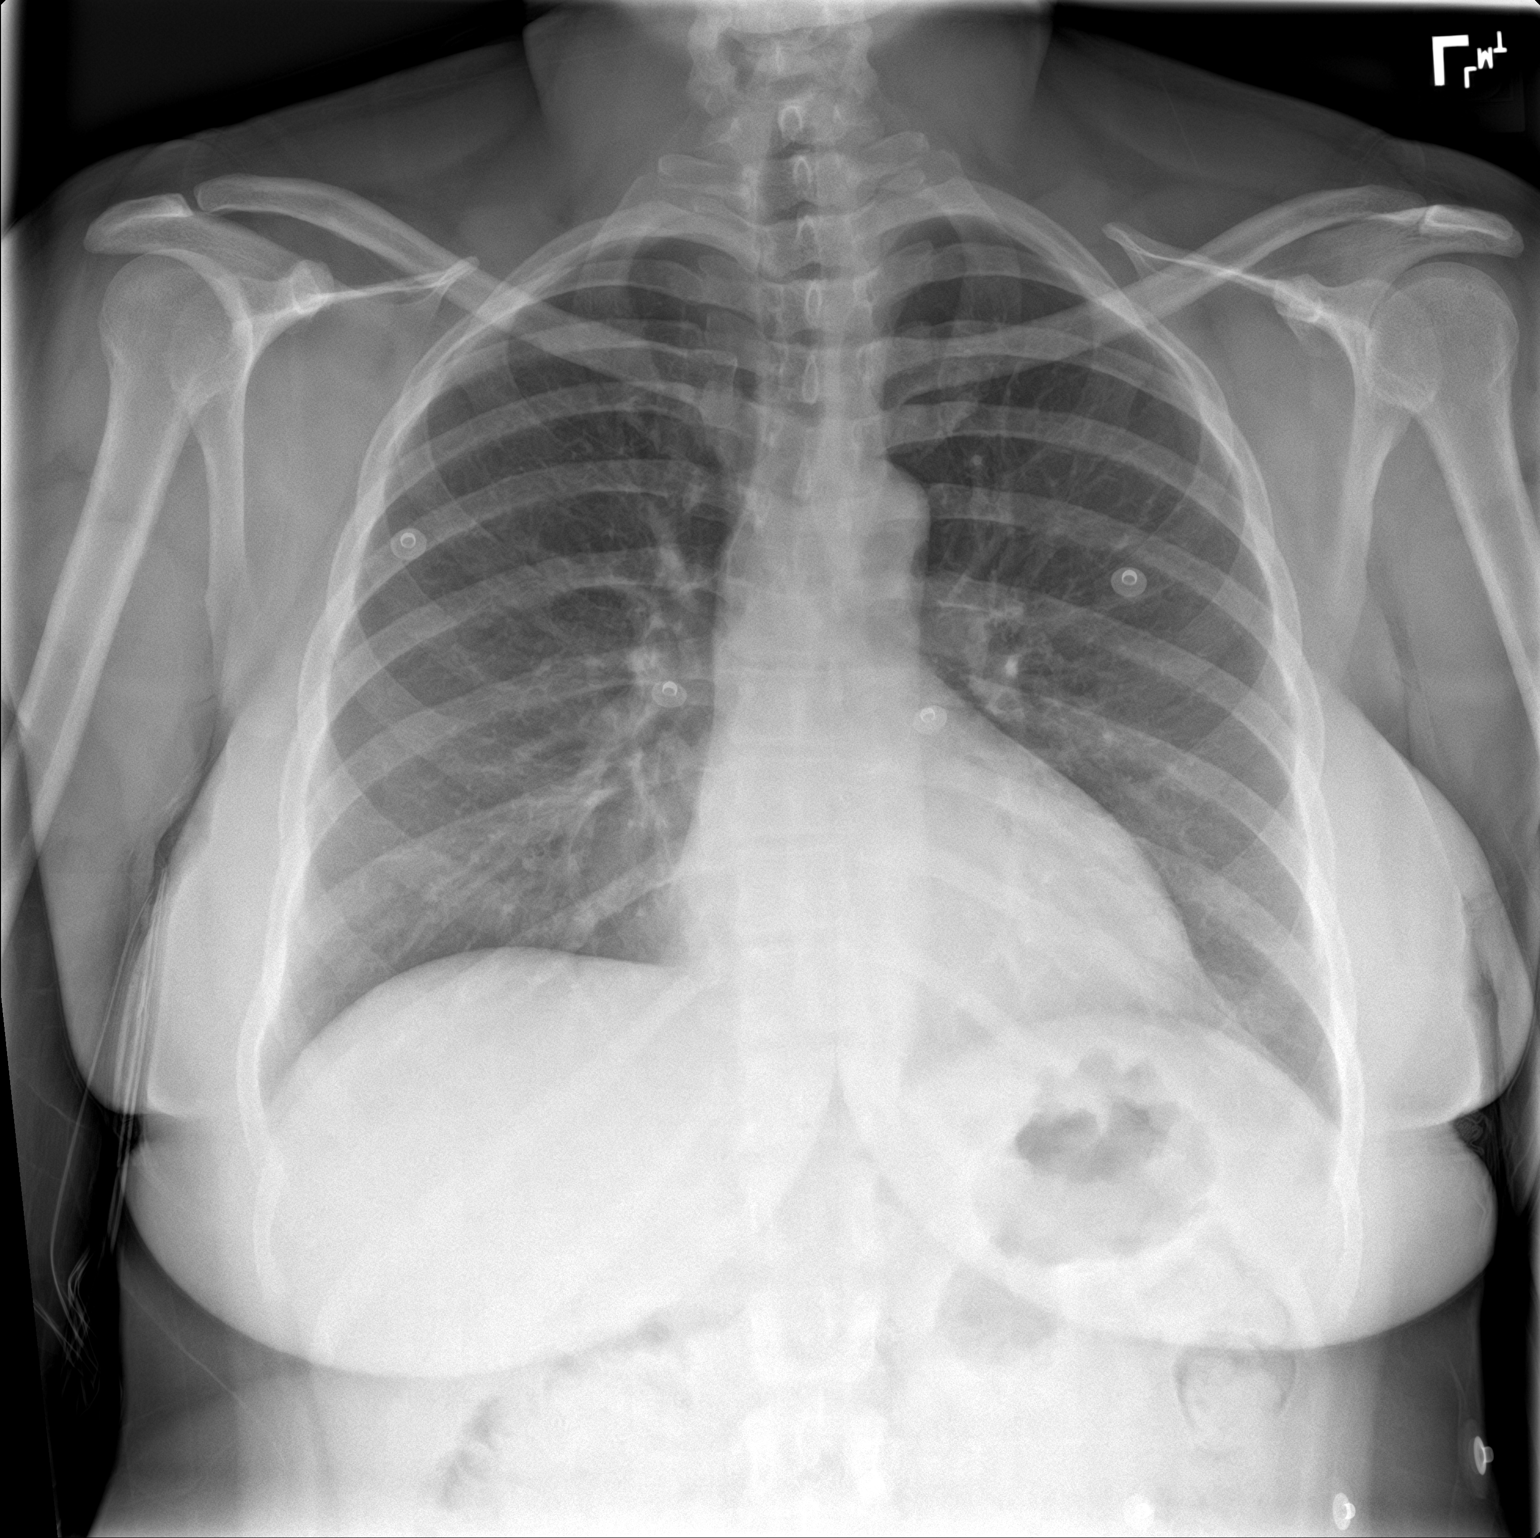

[chest lat]
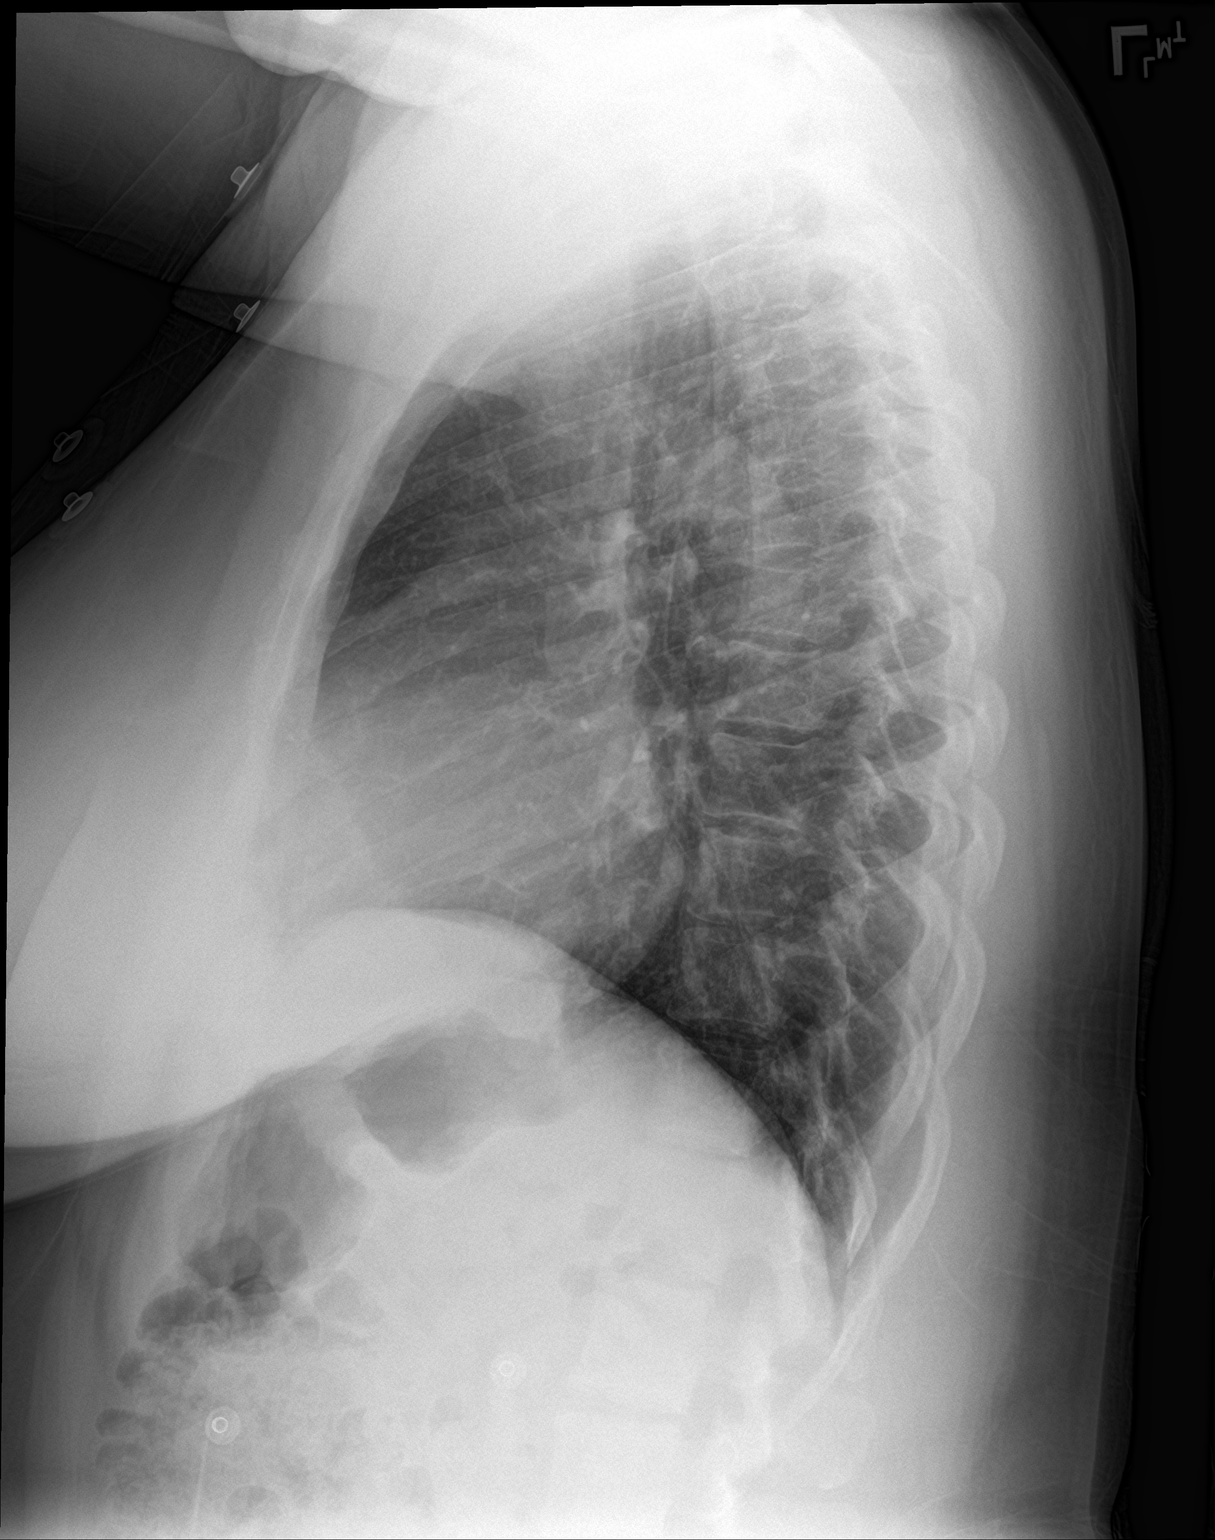

[2 of 2 positions shown; findings below may reference images not displayed]

FINDINGS: The cardiomediastinal silhouette is unremarkable.

There is no evidence of focal airspace disease, pulmonary edema,
suspicious pulmonary nodule/mass, pleural effusion, or pneumothorax.

No acute bony abnormalities are identified.
IMPRESSION: No active cardiopulmonary disease.
